# Patient Record
Sex: Male | Born: 1990 | Race: Black or African American | Hispanic: No | Marital: Single | Smoking: Never smoker
Health system: Southern US, Community
[De-identification: ages and names within clinical notes are randomized; demographics above are authoritative.]

## PROBLEM LIST (undated history)

## (undated) DIAGNOSIS — F909 Attention-deficit hyperactivity disorder, unspecified type: Secondary | ICD-10-CM

## (undated) DIAGNOSIS — F840 Autistic disorder: Secondary | ICD-10-CM

## (undated) HISTORY — DX: Attention-deficit hyperactivity disorder, unspecified type: F90.9

---
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
## 2009-03-09 ENCOUNTER — Ambulatory Visit: Payer: Self-pay | Admitting: Nurse Practitioner

## 2009-03-10 ENCOUNTER — Encounter (INDEPENDENT_AMBULATORY_CARE_PROVIDER_SITE_OTHER): Payer: Self-pay | Admitting: Nurse Practitioner

## 2009-03-10 LAB — CONVERTED CEMR LAB
ALT: 11 units/L (ref 0–53)
AST: 20 units/L (ref 0–37)
CO2: 18 meq/L — ABNORMAL LOW (ref 19–32)
Creatinine, Ser: 0.8 mg/dL (ref 0.40–1.50)
Sodium: 140 meq/L (ref 135–145)
Total Bilirubin: 0.4 mg/dL (ref 0.3–1.2)
Total Protein: 7.4 g/dL (ref 6.0–8.3)

## 2009-04-09 ENCOUNTER — Encounter (INDEPENDENT_AMBULATORY_CARE_PROVIDER_SITE_OTHER): Payer: Self-pay | Admitting: Nurse Practitioner

## 2009-04-16 ENCOUNTER — Encounter (INDEPENDENT_AMBULATORY_CARE_PROVIDER_SITE_OTHER): Payer: Self-pay | Admitting: Nurse Practitioner

## 2010-03-08 NOTE — Letter (Signed)
Summary: MAILED REQUESTED RECORDS TO [email or web address]  MAILED REQUESTED RECORDS TO [email or web address]   Imported By: Arta Bruce 04/09/2009 14:19:24  _____________________________________________________________________  External Attachment:    Type:   Image     Comment:   External Document

## 2010-03-08 NOTE — Assessment & Plan Note (Signed)
Summary: ????//KT   Allergies: No Known Drug Allergies   Complete Medication List: 1)  Vyvanse 60 Mg Caps (Lisdexamfetamine dimesylate) .... Take 1 capsule every morning *vuaya bogavelli 2)  Ceron-dm 12.06-09-13 Mg/98ml Syrp (Phenylephrine-chlorphen-dm) .... One teaspoon every 6 hours as needed for congestion  Other Orders: T-Comprehensive Metabolic Panel [phone])

## 2010-03-08 NOTE — Letter (Signed)
Summary: [facility]   Imported By: Arta Bruce 04/29/2009 10:57:51  _____________________________________________________________________  External Attachment:    Type:   Image     Comment:   External Document

## 2010-03-08 NOTE — Letter (Signed)
Summary: mailed records to [facility]: [place]  mailed records to [facility]: [place]   Imported By: Arta Bruce 04/16/2009 15:12:08  _____________________________________________________________________  External Attachment:    Type:   Image     Comment:   External Document

## 2010-05-15 LAB — RAPID STREP SCREEN (MED CTR MEBANE ONLY): Streptococcus, Group A Screen (Direct): NEGATIVE

## 2012-12-24 ENCOUNTER — Ambulatory Visit: Payer: Self-pay | Admitting: Internal Medicine

## 2013-02-10 ENCOUNTER — Encounter: Payer: Self-pay | Admitting: Internal Medicine

## 2013-02-10 ENCOUNTER — Ambulatory Visit: Payer: Medicaid Other | Attending: Internal Medicine | Admitting: Internal Medicine

## 2013-02-10 VITALS — BP 120/72 | HR 67 | Temp 98.8°F | Resp 16 | Ht 74.0 in | Wt 159.0 lb

## 2013-02-10 DIAGNOSIS — Z0000 Encounter for general adult medical examination without abnormal findings: Secondary | ICD-10-CM | POA: Insufficient documentation

## 2013-02-10 DIAGNOSIS — F840 Autistic disorder: Secondary | ICD-10-CM | POA: Insufficient documentation

## 2013-02-10 LAB — LIPID PANEL
CHOL/HDL RATIO: 3 ratio
Cholesterol: 184 mg/dL (ref 0–200)
HDL: 62 mg/dL (ref >39)
LDL Cholesterol: 113 mg/dL — ABNORMAL HIGH (ref 0–99)
TRIGLYCERIDES: 47 mg/dL (ref <150)
VLDL: 9 mg/dL (ref 0–40)

## 2013-02-10 LAB — CBC WITH DIFFERENTIAL/PLATELET
BASOS PCT: 1 % (ref 0–1)
Basophils Absolute: 0 10*3/uL (ref 0.0–0.1)
Eosinophils Absolute: 0.4 10*3/uL (ref 0.0–0.7)
Eosinophils Relative: 11 % — ABNORMAL HIGH (ref 0–5)
HCT: 44.8 % (ref 39.0–52.0)
Hemoglobin: 15.2 g/dL (ref 13.0–17.0)
Lymphocytes Relative: 36 % (ref 12–46)
Lymphs Abs: 1.3 10*3/uL (ref 0.7–4.0)
MCH: 29.6 pg (ref 26.0–34.0)
MCHC: 33.9 g/dL (ref 30.0–36.0)
MCV: 87.3 fL (ref 78.0–100.0)
Monocytes Absolute: 0.4 10*3/uL (ref 0.1–1.0)
Monocytes Relative: 10 % (ref 3–12)
NEUTROS ABS: 1.6 10*3/uL — AB (ref 1.7–7.7)
NEUTROS PCT: 42 % — AB (ref 43–77)
PLATELETS: 174 10*3/uL (ref 150–400)
RBC: 5.13 MIL/uL (ref 4.22–5.81)
RDW: 14 % (ref 11.5–15.5)
WBC: 3.7 10*3/uL — ABNORMAL LOW (ref 4.0–10.5)

## 2013-02-10 LAB — CMP AND LIVER
ALT: 13 U/L (ref 0–53)
AST: 17 U/L (ref 0–37)
Albumin: 4.5 g/dL (ref 3.5–5.2)
Alkaline Phosphatase: 74 U/L (ref 39–117)
BILIRUBIN DIRECT: 0.1 mg/dL (ref 0.0–0.3)
BILIRUBIN INDIRECT: 0.3 mg/dL (ref 0.0–0.9)
BILIRUBIN TOTAL: 0.4 mg/dL (ref 0.3–1.2)
BUN: 13 mg/dL (ref 6–23)
CALCIUM: 9.3 mg/dL (ref 8.4–10.5)
CHLORIDE: 105 meq/L (ref 96–112)
CO2: 29 meq/L (ref 19–32)
CREATININE: 0.81 mg/dL (ref 0.50–1.35)
GLUCOSE: 79 mg/dL (ref 70–99)
Potassium: 4.2 mEq/L (ref 3.5–5.3)
Sodium: 140 mEq/L (ref 135–145)
Total Protein: 7.3 g/dL (ref 6.0–8.3)

## 2013-02-10 NOTE — Progress Notes (Signed)
Patient ID: Tony Stevenson, male   DOB: 05/04/1990, 23 y.o.   MRN: [id number] Patient Demographics  Tony Stevenson, is a 23 y.o. male  QIO:[id number]  LKG:[id number]  DOB - 10/19/1990  CC:  Chief Complaint  Patient presents with  . Establish Care       HPI: Tony Plumpndre Dekay is a 23 y.o. male here today to establish medical care. Patient has history of autism and ADHD, presented with mom in the room for annual physical examination. He has no complaints. He works at a [employer] building.  He is sexually active with one male partner, uses protection (barrier method) for sexual activity. He does not engage in physical exercise but plays lots of the  Video games. He does not smoke cigarette, he does not drink alcohol. His immunizations are up-to-date. His medication includes Abilify 10 mg tablet by mouth daily. Patient has No headache, No chest pain, No abdominal pain - No Nausea, No new weakness tingling or numbness, No Cough - SOB.  No Known Allergies Past Medical History  Diagnosis Date  . ADHD (attention deficit hyperactivity disorder)    No current outpatient prescriptions on file prior to visit.   No current facility-administered medications on file prior to visit.   History reviewed. No pertinent family history. History   Social History  . Marital Status: Single    Spouse Name: N/A    Number of Children: N/A  . Years of Education: N/A   Occupational History  . Not on file.   Social History Main Topics  . Smoking status: Never Smoker   . Smokeless tobacco: Not on file  . Alcohol Use: No  . Drug Use: No  . Sexual Activity: Yes   Other Topics Concern  . Not on file   Social History Narrative  . No narrative on file    Review of Systems: Constitutional: Negative for fever, chills, diaphoresis, activity change, appetite change and fatigue. HENT: Negative for ear pain, nosebleeds, congestion, facial swelling, rhinorrhea, neck pain, neck stiffness and ear discharge.  Eyes: Negative  for pain, discharge, redness, itching and visual disturbance. Respiratory: Negative for cough, choking, chest tightness, shortness of breath, wheezing and stridor.  Cardiovascular: Negative for chest pain, palpitations and leg swelling. Gastrointestinal: Negative for abdominal distention. Genitourinary: Negative for dysuria, urgency, frequency, hematuria, flank pain, decreased urine volume, difficulty urinating and dyspareunia.  Musculoskeletal: Negative for back pain, joint swelling, arthralgia and gait problem. Neurological: Negative for dizziness, tremors, seizures, syncope, facial asymmetry, speech difficulty, weakness, light-headedness, numbness and headaches.  Hematological: Negative for adenopathy. Does not bruise/bleed easily. Psychiatric/Behavioral: Negative for hallucinations, behavioral problems, confusion, dysphoric mood, decreased concentration and agitation.    Objective:   Filed Vitals:   02/10/13 0929  BP: 120/72  Pulse: 67  Temp: 98.8 F (37.1 C)  Resp: 16    Physical Exam: Constitutional: Patient appears well-developed and well-nourished. No distress. HENT: Normocephalic, atraumatic, External right and left ear normal. Oropharynx is clear and moist.  Eyes: Conjunctivae and EOM are normal. PERRLA, no scleral icterus. Neck: Normal ROM. Neck supple. No JVD. No tracheal deviation. No thyromegaly. CVS: RRR, S1/S2 +, no murmurs, no gallops, no carotid bruit.  Pulmonary: Effort and breath sounds normal, no stridor, rhonchi, wheezes, rales.  Abdominal: Soft. BS +, no distension, tenderness, rebound or guarding.  Musculoskeletal: Normal range of motion. No edema and no tenderness.  Lymphadenopathy: No lymphadenopathy noted, cervical, inguinal or axillary Neuro: Alert. Normal reflexes, muscle tone coordination. No cranial nerve deficit. Skin: Skin is warm  and dry. No rash noted. Not diaphoretic. No erythema. No pallor. Psychiatric: Normal mood and affect. Behavior, judgment,  thought content normal.  Lab Results  Component Value Date   WBC 3.1* 10/13/2008   HGB 15.0 10/13/2008   HCT 46.0 10/13/2008   MCV 89.3 10/13/2008   PLT 192 10/13/2008   Lab Results  Component Value Date   CREATININE 0.80 03/09/2009   BUN 15 03/09/2009   NA 140 03/09/2009   K 4.3 03/09/2009   CL 104 03/09/2009   CO2 18* 03/09/2009    No results found for this basename: HGBA1C   Lipid Panel  No results found for this basename: chol, trig, hdl, cholhdl, vldl, ldlcalc       Assessment and plan:   1. Annual physical exam  - CMP and Liver - CBC with Differential - Urinalysis, Complete - Lipid panel  2. Autism disorder Stable  Patient was extensively counseled about the use of protection during sexual activities  Follow up in 6 months or when necessary   The patient was given clear instructions to go to ER or return to medical center if symptoms don't improve, worsen or new problems develop. The patient verbalized understanding. The patient was told to call to get lab results if they haven't heard anything in the next week.     Jeanann Lewandowsky, MD, MHA, [facility] [place], [place] [phone]   02/10/2013, 10:15 AM

## 2013-02-10 NOTE — Progress Notes (Signed)
Pt is here to establish care. Pt is requesting a physical.  

## 2013-02-11 LAB — URINALYSIS, COMPLETE
Bacteria, UA: NONE SEEN
Bilirubin Urine: NEGATIVE
Casts: NONE SEEN
Crystals: NONE SEEN
Glucose, UA: NEGATIVE mg/dL
HGB URINE DIPSTICK: NEGATIVE
Ketones, ur: NEGATIVE mg/dL
LEUKOCYTES UA: NEGATIVE
NITRITE: NEGATIVE
PROTEIN: NEGATIVE mg/dL
SQUAMOUS EPITHELIAL / LPF: NONE SEEN
Specific Gravity, Urine: 1.026 (ref 1.005–1.030)
Urobilinogen, UA: 1 mg/dL (ref 0.0–1.0)
pH: 5.5 (ref 5.0–8.0)

## 2013-02-13 ENCOUNTER — Telehealth: Payer: Self-pay | Admitting: *Deleted

## 2013-02-13 NOTE — Telephone Encounter (Signed)
Message copied by Raynelle CharyWINFREE, Monifah Freehling R on Thu Feb 13, 2013  9:19 AM ------      Message from: Jeanann LewandowskyJEGEDE, OLUGBEMIGA E      Created: Wed Feb 12, 2013  5:32 PM       Please inform patient that his lab tests all within normal ------

## 2013-02-13 NOTE — Telephone Encounter (Signed)
Spoke to pt's mom and informed her of sons lab results.

## 2013-07-15 ENCOUNTER — Encounter: Payer: Self-pay | Admitting: *Deleted

## 2013-07-15 NOTE — Progress Notes (Unsigned)
Patient ID: Tony Stevenson, male   DOB: Nov 17, 1990, 23 y.o.   MRN: [id number] Patient's mom came in to office stating she needs a note for son stating that he is disabled for at least 3 years or more. Letter needs to be faxed to 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 Reather Laurence, RN

## 2013-07-31 ENCOUNTER — Encounter: Payer: Self-pay | Admitting: Internal Medicine

## 2013-08-11 ENCOUNTER — Ambulatory Visit: Payer: Medicaid Other | Admitting: Internal Medicine

## 2015-03-03 ENCOUNTER — Emergency Department (HOSPITAL_COMMUNITY)
Admission: EM | Admit: 2015-03-03 | Discharge: 2015-03-03 | Disposition: A | Discharge status: Home / Self Care | Payer: Medicaid Other | Attending: Emergency Medicine | Admitting: Emergency Medicine

## 2015-03-03 ENCOUNTER — Encounter (HOSPITAL_COMMUNITY): Payer: Self-pay | Admitting: Neurology

## 2015-03-03 DIAGNOSIS — F840 Autistic disorder: Secondary | ICD-10-CM | POA: Insufficient documentation

## 2015-03-03 DIAGNOSIS — J029 Acute pharyngitis, unspecified: Secondary | ICD-10-CM | POA: Diagnosis not present

## 2015-03-03 DIAGNOSIS — F909 Attention-deficit hyperactivity disorder, unspecified type: Secondary | ICD-10-CM | POA: Diagnosis not present

## 2015-03-03 DIAGNOSIS — Z79899 Other long term (current) drug therapy: Secondary | ICD-10-CM | POA: Diagnosis not present

## 2015-03-03 HISTORY — DX: Autistic disorder: F84.0

## 2015-03-03 LAB — RAPID STREP SCREEN (MED CTR MEBANE ONLY): STREPTOCOCCUS, GROUP A SCREEN (DIRECT): NEGATIVE

## 2015-03-03 NOTE — ED Notes (Signed)
Pt reports sore throat since last night. Denies fevers. Mask in place

## 2015-03-03 NOTE — ED Provider Notes (Signed)
CSN: [id number]     Arrival date & time 03/03/15  0920 History  By signing my name below, I, Murriel Hopper, attest that this documentation has been prepared under the direction and in the presence of [employer], PA-C.  Electronically Signed: Murriel Hopper, ED Scribe. 03/03/2015. 10:09 AM.    Chief Complaint  Patient presents with  . Sore Throat      Patient is a 25 y.o. male presenting with pharyngitis. The history is provided by the patient. No language interpreter was used.  Sore Throat   HPI Comments: Tony Stevenson is a 25 y.o. male who presents to the Emergency Department complaining of a constant, worsening sore throat that has been present since last night. His mother also reports pt having a cough for around a week, and states pt was coughing a lot this morning. . Pt denies taking any medications to treat his symptoms. Pt denies fevers, nausea, vomiting, chest congestion.    Past Medical History  Diagnosis Date  . ADHD (attention deficit hyperactivity disorder)   . Autism    History reviewed. No pertinent past surgical history. No family history on file. Social History  Substance Use Topics  . Smoking status: Never Smoker   . Smokeless tobacco: None  . Alcohol Use: No    Review of Systems  Constitutional: Negative for fever.  HENT: Positive for sore throat. Negative for congestion.   Respiratory: Positive for cough.   Gastrointestinal: Negative for nausea and vomiting.      Allergies  Review of patient's allergies indicates no known allergies.  Home Medications   Prior to Admission medications   Medication Sig Start Date End Date Taking? Authorizing Provider  ARIPiprazole (ABILIFY) 10 MG tablet Take 10 mg by mouth daily.    Historical Provider, MD   BP 122/60 mmHg  Pulse 73  Temp(Src) 98.3 F (36.8 C)  Resp 16  SpO2 97% Physical Exam  Constitutional: He is oriented to person, place, and time. He appears well-developed and well-nourished.  HENT:  Head:  Normocephalic and atraumatic.  Mouth/Throat: Uvula is midline.  Uvula midline- rises with phonation No postpharyngeal swelling or edema  Redness to left anterior tonsillar pillar No swelling, tonsillar exudate or asymmetry of tonsils  Cardiovascular: Normal rate.   Pulmonary/Chest: Effort normal.  Abdominal: He exhibits no distension.  Neurological: He is alert and oriented to person, place, and time.  Skin: Skin is warm and dry.  Psychiatric: He has a normal mood and affect.  Nursing note and vitals reviewed.   ED Course  Procedures (including critical care time)  DIAGNOSTIC STUDIES: Oxygen Saturation is 97% on room air, normal by my interpretation.    COORDINATION OF CARE: 10:06 AM Discussed treatment plan with pt at bedside and pt agreed to plan. Pt will be screened for strep throat.    Labs Review Labs Reviewed  RAPID STREP SCREEN (NOT AT [facility])  CULTURE, GROUP A STREP [facility])    Imaging Review No results found. I have personally reviewed and evaluated these images and lab results as part of my medical decision-making.   EKG Interpretation None      MDM   Final diagnoses:  Pharyngitis  Labs: Rapid strep negative  Imaging:  Consults:  Therapeutics:  Discharge Meds:   Assessment/Plan: Patient presents for evaluation of sore throat. He has a normal physical exam with the exception of minor amount of redness to the left anterior tonsillar pillar with no signs of swelling. He has no exudate, tonsils are  equal bilateral, no muffled voice, drooling, stiff neck, fevers indicate peritonsillar abscess, retropharyngeal abscess, or significant bacterial infection. Patient was instructed to use warm saltwater gargles, follow-up with primary care provider in 2-3 days for reevaluation, sooner as needed for worsening signs or symptoms. Both the patient is mother verbalized understanding and agreement for today's plan and had no further questions or concerns at time of  discharge.    I personally performed the services described in this documentation, which was scribed in my presence. The recorded information has been reviewed and is accurate.    Eyvonne Mechanic, PA-C 03/03/15 1246  Benjiman Core, MD 03/04/15 [phone]

## 2015-03-03 NOTE — ED Notes (Signed)
Strep swab sent to lab

## 2015-03-03 NOTE — Discharge Instructions (Signed)

## 2015-03-03 NOTE — ED Notes (Signed)
Declined W/C at D/C and was escorted to lobby by RN. 

## 2015-03-05 LAB — CULTURE, GROUP A STREP (THRC)

## 2016-04-09 ENCOUNTER — Emergency Department (HOSPITAL_COMMUNITY)
Admission: EM | Admit: 2016-04-09 | Discharge: 2016-04-09 | Disposition: A | Discharge status: Home / Self Care | Payer: Medicaid Other | Attending: Emergency Medicine | Admitting: Emergency Medicine

## 2016-04-09 ENCOUNTER — Encounter (HOSPITAL_COMMUNITY): Payer: Self-pay

## 2016-04-09 DIAGNOSIS — F840 Autistic disorder: Secondary | ICD-10-CM | POA: Diagnosis not present

## 2016-04-09 DIAGNOSIS — R05 Cough: Secondary | ICD-10-CM | POA: Diagnosis present

## 2016-04-09 DIAGNOSIS — J069 Acute upper respiratory infection, unspecified: Secondary | ICD-10-CM | POA: Diagnosis not present

## 2016-04-09 DIAGNOSIS — F909 Attention-deficit hyperactivity disorder, unspecified type: Secondary | ICD-10-CM | POA: Diagnosis not present

## 2016-04-09 MED ORDER — GUAIFENESIN 100 MG/5ML PO SYRP: 100.0000 mg | ORAL_SOLUTION | ORAL | 0 refills | Status: DC | PRN

## 2016-04-09 NOTE — ED Provider Notes (Signed)
[facility]-EMERGENCY DEPT Provider Note   CSN: [id number] Arrival date & time: 04/09/16  4098  By signing my name below, I, Rosario Adie, attest that this documentation has been prepared under the direction and in the presence of [facility], PA-C.  Electronically Signed: Rosario Adie, ED Scribe. 04/09/16. 11:06 AM.  History   Chief Complaint Chief Complaint  Patient presents with  . Sore Throat   The history is provided by the patient. No language interpreter was used.    HPI Comments: Tony Stevenson is a 26 y.o. male with no pertinent PMHx who presents to the Emergency Department complaining of persistent, gradually improving sore throat beginning two weeks ago. Pt notes associated congestion, rhinorrhea, increasing sneezing, persistent, non-productive cough, and subjective fever which began yesterday as well. No treatments for his symptoms were tried prior to coming into the ED. He denies nausea, vomiting, or any other associated symptoms.   Past Medical History:  Diagnosis Date  . ADHD (attention deficit hyperactivity disorder)   . Autism    Patient Active Problem List   Diagnosis Date Noted  . Annual physical exam 02/10/2013  . Autism disorder 02/10/2013  . NASOPHARYNGITIS, ACUTE 10/13/2008  . ADHD 06/13/2007   History reviewed. No pertinent surgical history.  Home Medications    Prior to Admission medications   Medication Sig Start Date End Date Taking? Authorizing Provider  ARIPiprazole (ABILIFY) 10 MG tablet Take 10 mg by mouth daily.    Historical Provider, MD  guaifenesin (ROBITUSSIN) 100 MG/5ML syrup Take 5 mLs (100 mg total) by mouth every 4 (four) hours as needed for cough or congestion. 04/09/16   Chase Picket Jovon Streetman, PA-C   Family History No family history on file.  Social History Social History  Substance Use Topics  . Smoking status: Never Smoker  . Smokeless tobacco: Never Used  . Alcohol use No   Allergies   Patient has no known  allergies.  Review of Systems Review of Systems  Constitutional: Positive for fever (subjective).  HENT: Positive for congestion, rhinorrhea, sneezing and sore throat.   Respiratory: Positive for cough.   Gastrointestinal: Negative for nausea and vomiting.   Physical Exam Updated Vital Signs BP 117/72 (BP Location: Left Arm)   Pulse 80   Temp 98.3 F (36.8 C) (Oral)   Resp 18   SpO2 100%   Physical Exam  Constitutional: He is oriented to person, place, and time. He appears well-developed and well-nourished. No distress.  HENT:  Head: Normocephalic and atraumatic.  OP with erythema, no exudates or tonsillar hypertrophy. + nasal congestion with mucosal edema.   Neck: Normal range of motion. Neck supple.  No meningeal signs.   Cardiovascular: Normal rate, regular rhythm and normal heart sounds.   No murmur heard. Pulmonary/Chest: Effort normal and breath sounds normal. No respiratory distress.  Lungs are clear to auscultation bilaterally - no w/r/r  Abdominal: Soft. He exhibits no distension. There is no tenderness.  Musculoskeletal: Normal range of motion. He exhibits no edema.  Neurological: He is alert and oriented to person, place, and time.  Skin: Skin is warm and dry. He is not diaphoretic.  Nursing note and vitals reviewed.  ED Treatments / Results  DIAGNOSTIC STUDIES: Oxygen Saturation is 100% on RA, normal by my interpretation.   COORDINATION OF CARE: 11:05 AM-Discussed next steps with pt. Pt verbalized understanding and is agreeable with the plan.   Labs (all labs ordered are listed, but only abnormal results are displayed) Labs Reviewed - No  data to display  EKG  EKG Interpretation None      Radiology No results found.  Procedures Procedures   Medications Ordered in ED Medications - No data to display  Initial Impression / Assessment and Plan / ED Course  I have reviewed the triage vital signs and the nursing notes.  Pertinent labs & imaging  results that were available during my care of the patient were reviewed by me and considered in my medical decision making (see chart for details).    Tony Stevenson is afebrile, non-toxic appearing with a clear lung exam. Mild rhinorrhea and OP with erythema, no exudates. Likely viral URI. Patient is agreeable to symptomatic treatment with close follow up with PCP as needed but spoke at length about emergent, changing, or worsening of symptoms that should prompt return to ER. Patient voices understanding and is agreeable to plan.   Blood pressure 117/72, pulse 80, temperature 98.3 F (36.8 C), temperature source Oral, resp. rate 18, SpO2 100 %.  Final Clinical Impressions(s) / ED Diagnoses   Final diagnoses:  Upper respiratory tract infection, unspecified type   New Prescriptions Discharge Medication List as of 04/09/2016 11:10 AM    START taking these medications   Details  guaifenesin (ROBITUSSIN) 100 MG/5ML syrup Take 5 mLs (100 mg total) by mouth every 4 (four) hours as needed for cough or congestion., Starting Sun 04/09/2016, Print       I personally performed the services described in this documentation, which was scribed in my presence. The recorded information has been reviewed and is accurate.    [facility] HospitalJaime Pilcher Mailani Degroote, PA-C 04/09/16 1130    Linwood DibblesJon Knapp, MD 04/09/16 [phone]

## 2016-04-09 NOTE — ED Triage Notes (Signed)
Patient complains of 2 weeks of congestion, cough, runny nose and sore throat x 2 weeks. Alert and oriented, NAD

## 2016-04-09 NOTE — ED Notes (Signed)
Declined W/C at D/C and was escorted to lobby by RN. 

## 2016-04-09 NOTE — Discharge Instructions (Signed)
Take Claritin or Zyrtec daily. Increase hydration.  Guaifenesin as needed for cough/congestion. Follow-up with your primary care provider if symptoms do not improve in the next 7 days. Return to ER for new or worsening symptoms, any additional concerns.

## 2019-09-10 ENCOUNTER — Other Ambulatory Visit: Payer: Self-pay | Admitting: Family Medicine

## 2019-09-10 ENCOUNTER — Ambulatory Visit: Payer: Self-pay

## 2019-09-10 ENCOUNTER — Other Ambulatory Visit: Payer: Self-pay

## 2019-09-10 DIAGNOSIS — M79644 Pain in right finger(s): Secondary | ICD-10-CM

## 2020-11-18 ENCOUNTER — Other Ambulatory Visit: Payer: Self-pay

## 2020-11-18 ENCOUNTER — Emergency Department (HOSPITAL_COMMUNITY)
Admission: EM | Admit: 2020-11-18 | Discharge: 2020-11-18 | Disposition: A | Discharge status: Home / Self Care | Payer: Medicaid Other | Attending: Emergency Medicine | Admitting: Emergency Medicine

## 2020-11-18 ENCOUNTER — Emergency Department (HOSPITAL_COMMUNITY): Payer: Medicaid Other

## 2020-11-18 ENCOUNTER — Encounter (HOSPITAL_COMMUNITY): Payer: Self-pay | Admitting: Emergency Medicine

## 2020-11-18 DIAGNOSIS — S069X1A Unspecified intracranial injury with loss of consciousness of 30 minutes or less, initial encounter: Secondary | ICD-10-CM | POA: Diagnosis present

## 2020-11-18 DIAGNOSIS — S4992XA Unspecified injury of left shoulder and upper arm, initial encounter: Secondary | ICD-10-CM | POA: Diagnosis not present

## 2020-11-18 DIAGNOSIS — S199XXA Unspecified injury of neck, initial encounter: Secondary | ICD-10-CM | POA: Insufficient documentation

## 2020-11-18 DIAGNOSIS — S0001XA Abrasion of scalp, initial encounter: Secondary | ICD-10-CM | POA: Insufficient documentation

## 2020-11-18 DIAGNOSIS — Y92410 Unspecified street and highway as the place of occurrence of the external cause: Secondary | ICD-10-CM | POA: Insufficient documentation

## 2020-11-18 MED ORDER — ACETAMINOPHEN 500 MG PO TABS
1000.0000 mg | ORAL_TABLET | Freq: Once | ORAL | Status: AC
  Administered 2020-11-18: 1000 mg via ORAL
  Filled 2020-11-18: qty 2
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
## 2020-11-18 NOTE — ED Notes (Addendum)
d 
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
## 2023-03-23 ENCOUNTER — Encounter (HOSPITAL_COMMUNITY): Payer: Self-pay

## 2023-03-23 ENCOUNTER — Emergency Department (HOSPITAL_COMMUNITY)
Admission: EM | Admit: 2023-03-23 | Discharge: 2023-03-24 | Disposition: A | Discharge status: Home / Self Care | Payer: MEDICAID | Attending: Emergency Medicine | Admitting: Emergency Medicine

## 2023-03-23 ENCOUNTER — Other Ambulatory Visit: Payer: Self-pay

## 2023-03-23 ENCOUNTER — Emergency Department (HOSPITAL_COMMUNITY): Payer: MEDICAID

## 2023-03-23 DIAGNOSIS — R220 Localized swelling, mass and lump, head: Secondary | ICD-10-CM | POA: Diagnosis present

## 2023-03-23 DIAGNOSIS — K047 Periapical abscess without sinus: Secondary | ICD-10-CM | POA: Diagnosis not present

## 2023-03-23 DIAGNOSIS — M272 Inflammatory conditions of jaws: Secondary | ICD-10-CM

## 2023-03-23 LAB — CBC WITH DIFFERENTIAL/PLATELET
Abs Immature Granulocytes: 0.03 10*3/uL (ref 0.00–0.07)
Basophils Absolute: 0 10*3/uL (ref 0.0–0.1)
Basophils Relative: 0 %
Eosinophils Absolute: 0.1 10*3/uL (ref 0.0–0.5)
Eosinophils Relative: 1 %
HCT: 44.8 % (ref 39.0–52.0)
Hemoglobin: 14.7 g/dL (ref 13.0–17.0)
Immature Granulocytes: 0 %
Lymphocytes Relative: 23 %
Lymphs Abs: 2.4 10*3/uL (ref 0.7–4.0)
MCH: 28.3 pg (ref 26.0–34.0)
MCHC: 32.8 g/dL (ref 30.0–36.0)
MCV: 86.3 fL (ref 80.0–100.0)
Monocytes Absolute: 1.1 10*3/uL — ABNORMAL HIGH (ref 0.1–1.0)
Monocytes Relative: 11 %
Neutro Abs: 6.9 10*3/uL (ref 1.7–7.7)
Neutrophils Relative %: 65 %
Platelets: 249 10*3/uL (ref 150–400)
RBC: 5.19 MIL/uL (ref 4.22–5.81)
RDW: 12.7 % (ref 11.5–15.5)
WBC: 10.5 10*3/uL (ref 4.0–10.5)
nRBC: 0 % (ref 0.0–0.2)

## 2023-03-23 LAB — COMPREHENSIVE METABOLIC PANEL
ALT: 64 U/L — ABNORMAL HIGH (ref 0–44)
AST: 28 U/L (ref 15–41)
Albumin: 3.5 g/dL (ref 3.5–5.0)
Alkaline Phosphatase: 63 U/L (ref 38–126)
Anion gap: 9 (ref 5–15)
BUN: 16 mg/dL (ref 6–20)
CO2: 24 mmol/L (ref 22–32)
Calcium: 8.7 mg/dL — ABNORMAL LOW (ref 8.9–10.3)
Chloride: 102 mmol/L (ref 98–111)
Creatinine, Ser: 0.81 mg/dL (ref 0.61–1.24)
GFR, Estimated: >60 mL/min (ref >60)
Glucose, Bld: 97 mg/dL (ref 70–99)
Potassium: 3.7 mmol/L (ref 3.5–5.1)
Sodium: 135 mmol/L (ref 135–145)
Total Bilirubin: 0.5 mg/dL (ref 0.0–1.2)
Total Protein: 7.1 g/dL (ref 6.5–8.1)

## 2023-03-23 LAB — I-STAT CG4 LACTIC ACID, ED
Lactic Acid, Venous: 0.9 mmol/L (ref 0.5–1.9)
Lactic Acid, Venous: 1 mmol/L (ref 0.5–1.9)

## 2023-03-23 LAB — AMYLASE: Amylase: 56 U/L (ref 28–100)

## 2023-03-23 MED ORDER — CLINDAMYCIN PHOSPHATE 600 MG/50ML IV SOLN
600.0000 mg | Freq: Four times a day (QID) | INTRAVENOUS | Status: DC
  Administered 2023-03-23 – 2023-03-24 (×4): 600 mg via INTRAVENOUS
  Filled 2023-03-23 (×6): qty 50

## 2023-03-23 MED ORDER — IOHEXOL 350 MG/ML SOLN
75.0000 mL | Freq: Once | INTRAVENOUS | Status: AC | PRN
  Administered 2023-03-23: 75 mL via INTRAVENOUS

## 2023-03-23 MED ORDER — SODIUM CHLORIDE 0.9 % IV BOLUS
1000.0000 mL | Freq: Once | INTRAVENOUS | Status: AC
  Administered 2023-03-23: 1000 mL via INTRAVENOUS

## 2023-03-23 MED ORDER — CLINDAMYCIN PHOSPHATE 600 MG/50ML IV SOLN
600.0000 mg | Freq: Once | INTRAVENOUS | Status: AC
  Administered 2023-03-23: 600 mg via INTRAVENOUS
  Filled 2023-03-23: qty 50

## 2023-03-23 NOTE — ED Notes (Signed)
CALLED [facility] FOR ORAL SURG.

## 2023-03-23 NOTE — Plan of Care (Addendum)
33 year old man no significant past medical history presented to emergency department complaining of pain and swelling of the right mandibular area.He states that he began having some pain in this area a week ago. He noted some swelling on Monday. He was seen by his dentist and received a shot of steroids and antibiotics. He states that it felt like it improved somewhat over a couple of days but then worsened last night overnight. He is able to breathe and swallow without difficulty. He denies any fever or chills. He has significant pain and has not taken anything consistently for pain.   Presentation to ED patient is having high-grade temperature 101.3 F otherwise hemodynamically stable. Lactic acid 1. CBC showing no evidence of leukocytosis. CMP unremarkable.  CT soft tissue neck: 1. Odontogenic soft tissue abscess abutting the right mandibular body measuring up to 2.5 cm, which is immediately adjacent to a site of cortical breakthrough at the base of a right mandibular molar. Soft tissue stranding extends into the submandibular and submental spaces, which places the patient at risk for Ludwig angina. 2. 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 3. Multiple predominantly perivascular ground-glass nodules in the right upper lobe measuring up to 4 mm. These are nonspecific, but given patient's age, they are favored to be infectious or inflammatory in nature. Further evaluation with a dedicated chest CT could be considered for full characterization.  ED physician Dr. Rhae Hammock  has been tried to transfer this patient to [facility] however they are at capacity and no bed available.  Afterward they reach out to oral surgeon at [facility] but they are not accepting the patient.  Afterward Dr. Rhae Hammock discussed the case with ENT surgeon Dr. Pollyann Kennedy who stated that as it is a oral -maxillofacial issue he will not able to provide  any assistance regarding that however but if the patient develops any neck issue or Ludwig's angina he will be able to assist.  I have spoken with Dr. Rhae Hammock and informed him that I will not able to accept this patient for admission given patient has dental abscess which is invading the mandibular body which needs to be drained and we do not have any oral surgeon available at [facility].  Only antibiotic treatment would not be enough to treat the deep tissue infection unless the abscess is drained.  Recommended to broadening the antibiotic to IV vancomycin and Zosyn and also need to obtain blood cultures as well.  Patient needs to be transferred to a facility where there is Oral surgeon is available.  Discussed this plan with Dr. Rhae Hammock and he will continue to try to transfer the patient to a facility with specialty service is  available.   Tereasa Coop, MD [facility] 03/23/2023, 7:29 PM

## 2023-03-23 NOTE — ED Provider Notes (Signed)
I was asked to follow-up on a consult regarding the patient after there were no beds available at [facility].  We did try to get in touch with one of our oral surgeons here but were unsuccessful as we do not have oral surgery on-call.  I did get in touch with [facility] and they are not accepting patients at this time either.  The patient will require admission for IV antibiotics at a minimum.  I did call Dr. Pollyann Kennedy who is our ENT physician here.  He cannot take care of the immediate abscess as this is an oral maxillofacial issue but he is on call if the patient were to develop any infection in his neck or develop Ludwig's angina.  A call was placed to hospitalist service for admission.  Physical Exam  BP 133/79 (BP Location: Right Arm)   Pulse 85   Temp 99.2 F (37.3 C) (Axillary)   Resp 18   Ht 6\' 3"  (1.905 m)   Wt 77.1 kg   SpO2 99%   BMI 21.25 kg/m   Physical Exam General: No acute distress Neck: There is some submandibular swelling noted over the right submandibular region with no crepitus  Procedures  Procedures  ED Course / MDM    Medical Decision Making Amount and/or Complexity of Data Reviewed Labs: ordered. Radiology: ordered.  Risk Prescription drug management. Decision regarding hospitalization.  I did speak with our hospitalist team here and since we do not have oral surgery here they will not accept the patient due to this.  On reassessment the patient states that he is feeling stable with no worsening symptoms and is well-appearing on exam.  Clindamycin every 6 hours is ordered.  Signed out to oncoming provider.  [facility] and [facility] both recommended calling back tomorrow to see if the bed situation has changed to see if they could accept the patient in transfer.        Durwin Glaze, MD 03/23/23 [phone]

## 2023-03-23 NOTE — ED Triage Notes (Signed)
Pt states he started having right sided facial swelling last night and it has worsened today.  Pt c/o pain in right side of face.

## 2023-03-23 NOTE — ED Provider Notes (Addendum)
 [place] EMERGENCY DEPARTMENT AT [facility] Provider Note   CSN: [id number] Arrival date & time: 03/23/23  4098     History  Chief Complaint  Patient presents with  . Facial Swelling    Tony Stevenson is a 33 y.o. male.  HPI 33 year old male presents today complaining of pain and swelling to the right mandibular area.  He states that he began having some pain in this area a week ago.  He noted some swelling on Monday.  He was seen by his dentist and received a shot of steroids and antibiotics.  He states that it felt like it improved somewhat over a couple of days but then worsened last night overnight.  He is able to breathe and swallow without difficulty.  He denies any fever or chills.  He has significant pain and has not taken anything consistently for pain.    Home Medications Prior to Admission medications   Medication Sig Start Date End Date Taking? Authorizing Provider  acetaminophen (TYLENOL) 500 MG tablet Take 500-1,000 mg by mouth 2 (two) times daily as needed for moderate pain (pain score 4-6).   Yes [provider]      Allergies    Mangifera indica fruit ext (mango) [mangifera indica]    Review of Systems   Review of Systems  Physical Exam Updated Vital Signs BP 134/62 (BP Location: Right Arm)   Pulse 87   Temp (!) 101.3 F (38.5 C) (Axillary)   Resp 18   Ht 1.905 m (6\' 3" )   Wt 77.1 kg   SpO2 100%   BMI 21.25 kg/m  Physical Exam Vitals reviewed.  Constitutional:      General: He is not in acute distress.    Appearance: He is not ill-appearing.  HENT:     Head: Normocephalic.     Right Ear: External ear normal.     Left Ear: External ear normal.     Nose: Nose normal.     Mouth/Throat:     Mouth: Mucous membranes are moist.     Pharynx: Oropharynx is clear.     Comments: Right mandibular swelling Intraoral exam reveals some swelling of the right mandibular area with diffuse poor dentition.  There is no trismus.  Oropharynx  appears clear Externally he has diffuse swelling along the right mandible that is diffusely tender with no obvious fluctuance or redness noted Eyes:     Pupils: Pupils are equal, round, and reactive to light.  Cardiovascular:     Rate and Rhythm: Normal rate and regular rhythm.     Pulses: Normal pulses.  Pulmonary:     Effort: Pulmonary effort is normal.     Breath sounds: Normal breath sounds.  Abdominal:     Palpations: Abdomen is soft.  Musculoskeletal:        General: Normal range of motion.     Cervical back: Normal range of motion.  Skin:    General: Skin is warm.     Capillary Refill: Capillary refill takes less than 2 seconds.  Neurological:     General: No focal deficit present.     Mental Status: He is alert.  Psychiatric:        Mood and Affect: Mood normal.    ED Results / Procedures / Treatments   Labs (all labs ordered are listed, but only abnormal results are displayed) Labs Reviewed  COMPREHENSIVE METABOLIC PANEL - Abnormal; Notable for the following components:      Result Value  Calcium 8.7 (*)    ALT 64 (*)    All other components within normal limits  CBC WITH DIFFERENTIAL/PLATELET - Abnormal; Notable for the following components:   Monocytes Absolute 1.1 (*)    All other components within normal limits  AMYLASE  I-STAT CG4 LACTIC ACID, ED  I-STAT CG4 LACTIC ACID, ED    EKG None  Radiology CT Maxillofacial W Contrast Result Date: 03/23/2023 CLINICAL DATA:  Sublingual/submandibular abscess right sided mandibular swelling; Soft tissue infection suspected, neck, xray done right sided mandibular swelling EXAM: CT MAXILLOFACIAL WITH CONTRAST; CT NECK WITH CONTRAST TECHNIQUE: Multidetector CT imaging of the maxillofacial structures was performed with intravenous contrast. Multiplanar CT image reconstructions were also generated. Multidetector CT imaging of the neck was performed using the standard protocol following the bolus administration of  intravenous contrast. RADIATION DOSE REDUCTION: This exam was performed according to the departmental dose-optimization program which includes automated exposure control, adjustment of the mA and/or kV according to patient size and/or use of iterative reconstruction technique. CONTRAST:  75mL OMNIPAQUE IOHEXOL 350 MG/ML SOLN COMPARISON:  None Available. FINDINGS: CT FACE FINDINGS: Osseous: No fracture or mandibular dislocation. There is severe odontogenic disease with multiple large dental caries and periapical lucencies along the bilateral maxillary and mandibular molars Orbits: Negative. No traumatic or inflammatory finding. Sinuses: No middle ear or mastoid effusion. Paranasal sinuses are clear Soft tissues: There is an odontogenic soft tissue abscess abutting the right mandibular body measuring proximally 2.5 x 0.8 cm (series 3, image 70), which is immediately adjacent to a site of cortical breakthrough (series 5, image 69) of the base of a right mandibular molar. There is overlying subcutaneous soft tissue stranding and skin thickening extending along the anterior aspect of the chin. Note that soft tissue stranding is also present in the submandibular and submental spaces, which places the patient at risk for Ludwig angina. Limited intracranial: Negative CT NECK FINDINGS: Pharynx and larynx: Bilateral tonsils are normal in appearance. The epiglottis is normal in appearance. There is no evidence of retropharyngeal effusion. Salivary glands: No inflammation, mass, or stone. Thyroid: Normal. Lymph nodes: There are prominent level 1A lymph nodes measuring up to 7 mm, favored to be reactive. Vascular: Negative. Limited intracranial: Negative. Visualized orbits: Negative. Mastoids and visualized paranasal sinuses: Clear. Skeleton: No acute or aggressive process. Upper chest: There are multiple predominantly perivascular ground-glass nodules in the right upper lobe measuring up to 4 mm. These are nonspecific, but  given patient's age, they are favored to be infectious or inflammatory in nature. Further evaluation with a dedicated chest CT could be considered for full characterization Other: None. IMPRESSION: 1. Odontogenic soft tissue abscess abutting the right mandibular body measuring up to 2.5 cm, which is immediately adjacent to a site of cortical breakthrough at the base of a right mandibular molar. Soft tissue stranding extends into the submandibular and submental spaces, which places the patient at risk for Ludwig angina. 2. 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 3. Multiple predominantly perivascular ground-glass nodules in the right upper lobe measuring up to 4 mm. These are nonspecific, but given patient's age, they are favored to be infectious or inflammatory in nature. Further evaluation with a dedicated chest CT could be considered for full characterization. Electronically Signed   By: Lorenza Cambridge M.D.   On: 03/23/2023 14:05   CT Soft Tissue Neck W Contrast Result Date: 03/23/2023 CLINICAL DATA:  Sublingual/submandibular abscess right sided mandibular  swelling; Soft tissue infection suspected, neck, xray done right sided mandibular swelling EXAM: CT MAXILLOFACIAL WITH CONTRAST; CT NECK WITH CONTRAST TECHNIQUE: Multidetector CT imaging of the maxillofacial structures was performed with intravenous contrast. Multiplanar CT image reconstructions were also generated. Multidetector CT imaging of the neck was performed using the standard protocol following the bolus administration of intravenous contrast. RADIATION DOSE REDUCTION: This exam was performed according to the departmental dose-optimization program which includes automated exposure control, adjustment of the mA and/or kV according to patient size and/or use of iterative reconstruction technique. CONTRAST:  75mL OMNIPAQUE IOHEXOL 350 MG/ML SOLN COMPARISON:   None Available. FINDINGS: CT FACE FINDINGS: Osseous: No fracture or mandibular dislocation. There is severe odontogenic disease with multiple large dental caries and periapical lucencies along the bilateral maxillary and mandibular molars Orbits: Negative. No traumatic or inflammatory finding. Sinuses: No middle ear or mastoid effusion. Paranasal sinuses are clear Soft tissues: There is an odontogenic soft tissue abscess abutting the right mandibular body measuring proximally 2.5 x 0.8 cm (series 3, image 70), which is immediately adjacent to a site of cortical breakthrough (series 5, image 69) of the base of a right mandibular molar. There is overlying subcutaneous soft tissue stranding and skin thickening extending along the anterior aspect of the chin. Note that soft tissue stranding is also present in the submandibular and submental spaces, which places the patient at risk for Ludwig angina. Limited intracranial: Negative CT NECK FINDINGS: Pharynx and larynx: Bilateral tonsils are normal in appearance. The epiglottis is normal in appearance. There is no evidence of retropharyngeal effusion. Salivary glands: No inflammation, mass, or stone. Thyroid: Normal. Lymph nodes: There are prominent level 1A lymph nodes measuring up to 7 mm, favored to be reactive. Vascular: Negative. Limited intracranial: Negative. Visualized orbits: Negative. Mastoids and visualized paranasal sinuses: Clear. Skeleton: No acute or aggressive process. Upper chest: There are multiple predominantly perivascular ground-glass nodules in the right upper lobe measuring up to 4 mm. These are nonspecific, but given patient's age, they are favored to be infectious or inflammatory in nature. Further evaluation with a dedicated chest CT could be considered for full characterization Other: None. IMPRESSION: 1. Odontogenic soft tissue abscess abutting the right mandibular body measuring up to 2.5 cm, which is immediately adjacent to a site of cortical  breakthrough at the base of a right mandibular molar. Soft tissue stranding extends into the submandibular and submental spaces, which places the patient at risk for Ludwig angina. 2. 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 3. Multiple predominantly perivascular ground-glass nodules in the right upper lobe measuring up to 4 mm. These are nonspecific, but given patient's age, they are favored to be infectious or inflammatory in nature. Further evaluation with a dedicated chest CT could be considered for full characterization. Electronically Signed   By: Lorenza Cambridge M.D.   On: 03/23/2023 14:05    Procedures Procedures    Medications Ordered in ED Medications  sodium chloride 0.9 % bolus 1,000 mL (1,000 mLs Intravenous New Bag/Given 03/23/23 1146)  iohexol (OMNIPAQUE) 350 MG/ML injection 75 mL (75 mLs Intravenous Contrast Given 03/23/23 1345)  clindamycin (CLEOCIN) IVPB 600 mg (600 mg Intravenous New Bag/Given 03/23/23 1441)    ED Course/ Medical Decision Making/ A&P                                 Medical Decision  Making Amount and/or Complexity of Data Reviewed Labs: ordered. Radiology: ordered.  Risk Prescription drug management.   33 year old male presents today with right mandibular swelling.  Differential diagnosis includes but is not limited to abscess from otogenic source versus other versus parotid disease versus mass Patient is evaluated here with labs and imaging. CBC complete metabolic panel, lactic, and amylase are normal. Patient had CT performed which is significant for odontogenic soft tissue abscess abutting the right mandible measuring 2.5 cm immediately adjacent to the site of cortical breakthrough at the base of the right mandibular molar.  There is soft tissue stranding extending into the submandibular and submental spaces, severe odontogenic disease with multiple large  dental caries, multiple predominantly perivascular groundglass nodules in the right upper lobe which may be infectious versus inflammatory Patient was treated here in the ED with clindamycin.  He was initially afebrile but did spike a fever to 101.3 He is given antipyretics Patient has remained hemodynamically stable have a low index of suspicion for sepsis. I attempted to page the dentist on call and left a message.  Called [facility] she states that they do not have a Dispensing optician.  Page is out to [facility]. Patient currently has no concern for airway compromise.  He is breathing without difficulty. Care signed out to Dr. Rhae Hammock pending disposition Dr. Lucky Cowboy called back and does not feel that patient appropriately managed by oral surgery Discussed with Dr. Sharee Holster on call for oral surgery who advises patient be seen by ENT and have dental see op in a few weeks and closed to external calls         Final Clinical Impression(s) / ED Diagnoses Final diagnoses:  Mandibular abscess    Rx / DC Orders ED Discharge Orders     None         Margarita Grizzle, MD 03/23/23 1548    Margarita Grizzle, MD 03/26/23 1710

## 2023-03-24 DIAGNOSIS — K047 Periapical abscess without sinus: Secondary | ICD-10-CM

## 2023-03-24 DIAGNOSIS — M272 Inflammatory conditions of jaws: Secondary | ICD-10-CM | POA: Diagnosis not present

## 2023-03-24 LAB — BASIC METABOLIC PANEL
Anion gap: 9 (ref 5–15)
BUN: 10 mg/dL (ref 6–20)
CO2: 23 mmol/L (ref 22–32)
Calcium: 8.7 mg/dL — ABNORMAL LOW (ref 8.9–10.3)
Chloride: 103 mmol/L (ref 98–111)
Creatinine, Ser: 0.84 mg/dL (ref 0.61–1.24)
GFR, Estimated: >60 mL/min (ref >60)
Glucose, Bld: 84 mg/dL (ref 70–99)
Potassium: 4.1 mmol/L (ref 3.5–5.1)
Sodium: 135 mmol/L (ref 135–145)

## 2023-03-24 LAB — CBC WITH DIFFERENTIAL/PLATELET
Abs Immature Granulocytes: 0.03 10*3/uL (ref 0.00–0.07)
Basophils Absolute: 0 10*3/uL (ref 0.0–0.1)
Basophils Relative: 1 %
Eosinophils Absolute: 0.2 10*3/uL (ref 0.0–0.5)
Eosinophils Relative: 3 %
HCT: 43.9 % (ref 39.0–52.0)
Hemoglobin: 14.7 g/dL (ref 13.0–17.0)
Immature Granulocytes: 1 %
Lymphocytes Relative: 36 %
Lymphs Abs: 1.8 10*3/uL (ref 0.7–4.0)
MCH: 28.9 pg (ref 26.0–34.0)
MCHC: 33.5 g/dL (ref 30.0–36.0)
MCV: 86.2 fL (ref 80.0–100.0)
Monocytes Absolute: 0.6 10*3/uL (ref 0.1–1.0)
Monocytes Relative: 13 %
Neutro Abs: 2.4 10*3/uL (ref 1.7–7.7)
Neutrophils Relative %: 46 %
Platelets: 209 10*3/uL (ref 150–400)
RBC: 5.09 MIL/uL (ref 4.22–5.81)
RDW: 12.7 % (ref 11.5–15.5)
WBC: 5 10*3/uL (ref 4.0–10.5)
nRBC: 0 % (ref 0.0–0.2)

## 2023-03-24 MED ORDER — AMOXICILLIN-POT CLAVULANATE 875-125 MG PO TABS: 1.0000 | ORAL_TABLET | Freq: Two times a day (BID) | ORAL | 0 refills | Status: AC

## 2023-03-24 MED ORDER — SODIUM CHLORIDE 0.9 % IV SOLN: Freq: Once | INTRAVENOUS | Status: AC

## 2023-03-24 NOTE — ED Provider Notes (Addendum)
 Transfer / Bed status checked at both [facility] and [facility] this AM.  [facility] is closed to surgical transfers.  [facility] is without beds.  [facility] does not have any oral surgery coverage.  Hospitalist service is aware of case and will reconsult regarding management.  Message left for Dr. Kenney Houseman ([facility]) who appears to come on service at [facility] on Tuesday (2/18) of this week.  Given that Dr. Kenney Houseman is not on-call this weekend or on Monday I doubt that I will receive a call back.  Patient is feeling much improved this morning.  He is desiring something to eat.  Patient with improved white count this morning.  Patient is afebrile overnight.  He reports improved swelling along the right mandible and neck.  Will continue clindamycin.  Patient understands difficulty with obtaining definitive care for his dental abscess.     Wynetta Fines, MD 03/24/23 1610    Wynetta Fines, MD 03/24/23 9604    Wynetta Fines, MD 03/24/23 [phone]

## 2023-03-24 NOTE — ED Notes (Signed)
 Labs drawn. Pt resting. No needs at this time.

## 2023-03-24 NOTE — ED Notes (Signed)
 Pt resting. No needs at this time. VS updated. Call light in reach.

## 2023-03-24 NOTE — ED Provider Notes (Signed)
  Provider Note MRN:  [id number]  Arrival date & time: 03/24/23    ED Course and Medical Decision Making  Assumed care of patient at sign-out or upon transfer.  Dental abscess receiving antibiotics, difficulty with placement, will continue to monitor.  Patient resting comfortably on my evaluation, says the swelling is improving, plan is to call [facility] and [facility] again in the morning to see if they have beds have opened up.  Signed out to oncoming provider.  Procedures  Final Clinical Impressions(s) / ED Diagnoses     ICD-10-CM   1. Mandibular abscess  M27.2       ED Discharge Orders     None       Discharge Instructions   None     Elmer Sow. Pilar Plate, MD [facility] Emergency Medicine [facility] [email or web address]    Sabas Sous, MD 03/24/23 [phone]

## 2023-03-24 NOTE — Discharge Instructions (Addendum)
 Return for any problem.   Follow up with Oral Surgery as instructed.

## 2023-03-24 NOTE — ED Notes (Signed)
 Pt resting in bed. No needs at this time. Call light in reach

## 2023-03-24 NOTE — ED Provider Notes (Signed)
 Patient now desires discharge.  He declines to wait further for oral maxillofacial evaluation.  Patient is taking p.o. well.  He is comfortable.  He reports significant decreased edema since arriving here in the ED.  Patient seen by hospitalist service this morning.  They agree with plan to discharge.  Patient understands need for close outpatient follow-up with oral maxillofacial surgery.  He is aware of basic return precautions.  Importance of close follow-up is stressed.   Wynetta Fines, MD 03/24/23 1515

## 2023-03-24 NOTE — Consult Note (Signed)
 Initial Consultation Note   Patient: Tony Stevenson ZOX:[id number] DOB: 12-13-1990 PCP: [facility] DOA: 03/23/2023 DOS: the patient was seen and examined on 03/24/2023 Primary service: Wynetta Fines, MD  Referring physician: Wynetta Fines, MD Reason for consult: Mandibular abscess  Assessment/Plan: Assessment and Plan: # Mandibular abscess Young patient with history of pharyngitis presenting with 1 week of progressive mandibular pain and swelling. Imaging in the ED shows evidence of mandibular abscess and soft tissue stranding extending into the submandibular and submental spaces. Multiple attempts have been made by the ED providers to transfer patient to facility with oral surgeon coverage and all has been unsuccessful. Patient received IV clindamycin on admission and now with significant improvement in his symptoms. He is afebrile without leukocytosis. Mandibular swelling and pain has since improved. He is protecting his airway and he is without any trismus, significant neck swelling or stridor. Patient requires I&D of the abscess to ultimately address his mandibular abscess. Since symptoms has improved with IV antibiotics, will recommend discharge home on oral antibiotics and a referral to oral surgeon. -Can monitor for another 4-6 hrs -Urgent refer to outpatient oral surgery if inpatient consult not available -Discharge home on Augmentin for total 7 days of antibiotics   [facility] will continue to follow the patient.  HPI: Tony Stevenson is a 33 y.o. male with past medical history of ADHD, autism spectrum disorder and pharyngitis who presented to the ED on the afternoon of 2/14 for evaluation of pain and swelling of the right mandibular area. Patient reports he noticed some swelling in his right  last Friday. On Monday, he was evaluated by his PCP and received a shot of steroids and antibiotic. He had some mild improvement in his symptoms however on Thursday night, his symptoms worsen with  worsening of the mandibular pain and swelling. He denies any fevers, chills, drooling, shortness of breath, headaches, vision changes or difficulty swallowing. 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 Patient was given IV clindamycin. ED provider paged the dentist on-call and left a message. They attempted to transfer patient to [facility] however they do not have any oral surgery coverage. They called [facility] but were informed that they are close to surgical transfers. [facility] was also called but they do not have any beds. [facility] was initially consulted for admission but recommended continuing IV antibiotics and transferring to facility with oral surgeon as patient requires surgical intervention to address his submandibular abscess. This morning, ED provider left a message for on-call [facility], Dr. Kenney Houseman, however he does not start service at [facility] until Tuesday. [facility] was consulted to evaluate for possible admission. Patient evaluated at the bedside laying comfortably in hallway bed. He reports his mandibular swelling has gone down he is without significant pain. He is able to open his mouth without any pain and is currently wanting something to eat. Patient also reports he has had similar presentation in the past and it improved with antibiotics.  Review of Systems: As mentioned in the history of present illness. All other systems reviewed and are negative. Past Medical History:  Diagnosis Date   ADHD (attention deficit hyperactivity disorder)    Autism    History reviewed. No pertinent surgical history. Social History:  reports that he has never smoked. He has  never used smokeless tobacco. He reports that he does not drink alcohol and does not use drugs.  Allergies  Allergen  Reactions   Mangifera Indica Fruit Ext (Mango) [Mangifera Indica] Swelling    History reviewed. No pertinent family history.  Prior to Admission medications   Medication Sig Start Date End Date Taking? Authorizing Provider  acetaminophen (TYLENOL) 500 MG tablet Take 500-1,000 mg by mouth 2 (two) times daily as needed for moderate pain (pain score 4-6).   Yes [provider]    Physical Exam: Vitals:   03/23/23 1915 03/24/23 0003 03/24/23 0445 03/24/23 0734  BP: 124/75 (!) 126/54 (!) 102/58 107/84  Pulse: 88 71 78 82  Resp: 16 18 18 17   Temp: 98.3 F (36.8 C) 98.3 F (36.8 C) 98.2 F (36.8 C) 98.1 F (36.7 C)  TempSrc: Oral Oral Oral Oral  SpO2: 97% 99% 95% 98%  Weight:      Height:       General: Pleasant, well-appearing young man laying in hallway bed. No acute distress. HEENT: Mild right mandibular swelling without tenderness, fluctuance or erythema. Poor dentition with multiple dental caries, tooth decay and missing teeth. Posterior oropharynx is clear without exudate.  No trismus.  No tongue edema or exudate.  Neck is nontender. CV: RRR. No murmurs, rubs, or gallops. No LE edema Pulmonary: Lungs CTAB. Normal effort. No wheezing or rales. Abdominal: Soft, nontender, nondistended. Normal bowel sounds. Extremities: Palpable radial and DP pulses. Normal ROM. Skin: Warm and dry. No obvious rash or lesions. Neuro: A&Ox3. Moves all extremities. Normal sensation to light touch. No focal deficit. Psych: Normal mood and affect  Data Reviewed:  Labs significant for normal CBC, CMP, amylase and lactic acid CT Maxillofacial W Contrast Result Date: 03/23/2023  IMPRESSION: 1. Odontogenic soft tissue abscess abutting the right mandibular body measuring up to 2.5 cm, which is immediately adjacent to a site of cortical breakthrough at the base of a right mandibular molar. Soft tissue stranding extends into the submandibular and submental spaces, which places the patient at risk for  Ludwig angina. 2. 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 3. Multiple predominantly perivascular ground-glass nodules in the right upper lobe measuring up to 4 mm. These are nonspecific, but given patient's age, they are favored to be infectious or inflammatory in nature. Further evaluation with a dedicated chest CT could be considered for full characterization. Electronically Signed   By: Lorenza Cambridge M.D.   On: 03/23/2023 14:05   CT Soft Tissue Neck W Contrast Result Date: 03/23/2023  IMPRESSION: 1. Odontogenic soft tissue abscess abutting the right mandibular body measuring up to 2.5 cm, which is immediately adjacent to a site of cortical breakthrough at the base of a right mandibular molar. Soft tissue stranding extends into the submandibular and submental spaces, which places the patient at risk for Ludwig angina. 2. 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 3. Multiple predominantly perivascular ground-glass nodules in the right upper lobe measuring up to 4 mm. These are nonspecific, but given patient's age, they are favored to be infectious or inflammatory in nature. Further evaluation with a dedicated chest CT could be considered for full characterization. Electronically Signed   By: Lorenza Cambridge M.D.   On: 03/23/2023 14:05      Family Communication: No family at bedside Primary team communication: Discussed recommendations to ED provider, Dr. Rodena Medin  Thank you very much for involving [place] in the care of your patient.  Author: Steffanie Rainwater, MD 03/24/2023 10:33 AM  For on call review [email or web address].

## 2023-03-25 DIAGNOSIS — M272 Inflammatory conditions of jaws: Secondary | ICD-10-CM

## 2023-03-25 DIAGNOSIS — K047 Periapical abscess without sinus: Secondary | ICD-10-CM

## 2023-04-04 ENCOUNTER — Emergency Department (HOSPITAL_COMMUNITY)
Admission: EM | Admit: 2023-04-04 | Discharge: 2023-04-05 | Disposition: A | Discharge status: Home / Self Care | Payer: MEDICAID | Attending: Emergency Medicine | Admitting: Emergency Medicine

## 2023-04-04 ENCOUNTER — Encounter (HOSPITAL_COMMUNITY): Payer: Self-pay

## 2023-04-04 ENCOUNTER — Other Ambulatory Visit: Payer: Self-pay

## 2023-04-04 ENCOUNTER — Emergency Department (HOSPITAL_COMMUNITY): Payer: MEDICAID

## 2023-04-04 DIAGNOSIS — M25511 Pain in right shoulder: Secondary | ICD-10-CM | POA: Insufficient documentation

## 2023-04-04 DIAGNOSIS — Y92410 Unspecified street and highway as the place of occurrence of the external cause: Secondary | ICD-10-CM | POA: Diagnosis not present

## 2023-04-04 DIAGNOSIS — S0990XA Unspecified injury of head, initial encounter: Secondary | ICD-10-CM | POA: Insufficient documentation
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
## 2023-04-05 MED ORDER — NAPROXEN 500 MG PO TABS
500.0000 mg | ORAL_TABLET | Freq: Two times a day (BID) | ORAL | 0 refills | Status: AC
Start: 2023-04-05 — End: ?

## 2023-04-05 NOTE — Discharge Instructions (Signed)
 Your workup this evening was reassuring.  I have prescribed anti-inflammatory medication to be taken as needed for your right shoulder pain.  You may also take Tylenol if needed.  If your pain does not improve over the next few weeks I recommend following up with orthopedic surgery for further evaluation.  I have attached contact information.
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
